# Patient Record
Sex: Female | Born: 1960 | Race: White
Health system: Southern US, Community
[De-identification: ages and names within clinical notes are randomized; demographics above are authoritative.]

---

## 1999-11-20 ENCOUNTER — Other Ambulatory Visit: Admission: RE | Admit: 1999-11-20 | Discharge: 1999-11-20 | Payer: Self-pay | Admitting: Family Medicine

## 2004-09-12 ENCOUNTER — Other Ambulatory Visit: Admission: RE | Admit: 2004-09-12 | Discharge: 2004-09-12 | Payer: Self-pay | Admitting: Family Medicine

## 2020-03-24 ENCOUNTER — Ambulatory Visit: Payer: Self-pay | Attending: Internal Medicine

## 2020-03-24 DIAGNOSIS — Z23 Encounter for immunization: Secondary | ICD-10-CM

## 2020-03-24 NOTE — Progress Notes (Signed)
   Covid-19 Vaccination Clinic  Name:  Mikah Rottinghaus    MRN: [id number] DOB: 05-23-1961  03/24/2020  Ms. Grimm was observed post Covid-19 immunization for 15 minutes without incident. She was provided with Vaccine Information Sheet and instruction to access the V-Safe system.   Ms. Hargrove was instructed to call 911 with any severe reactions post vaccine: Marland Kitchen Difficulty breathing  . Swelling of face and throat  . A fast heartbeat  . A bad rash all over body  . Dizziness and weakness   Immunizations Administered    Name Date Dose VIS Date Route   Moderna COVID-19 Vaccine 03/24/2020  8:20 AM 0.5 mL 11/30/2019 Intramuscular   Manufacturer: Moderna   Lot: [id number]   NDC: 43926-599-78

## 2020-04-25 ENCOUNTER — Ambulatory Visit: Payer: Self-pay | Attending: Internal Medicine

## 2020-04-25 DIAGNOSIS — Z23 Encounter for immunization: Secondary | ICD-10-CM

## 2020-04-25 NOTE — Progress Notes (Signed)
   Covid-19 Vaccination Clinic  Name:  Sherri Phillips    MRN: [id number] DOB: 1961/05/08  04/25/2020  [name] was observed post Covid-19 immunization for 15 minutes without incident. She was provided with Vaccine Information Sheet and instruction to access the V-Safe system.   [name] was instructed to call 911 with any severe reactions post vaccine: Marland Kitchen Difficulty breathing  . Swelling of face and throat  . A fast heartbeat  . A bad rash all over body  . Dizziness and weakness   Immunizations Administered    Name Date Dose VIS Date Route   Moderna COVID-19 Vaccine 04/25/2020  9:45 AM 0.5 mL 11/2019 Intramuscular   Manufacturer: Moderna   Lot: [id number]   NDC: 78242-353-61

## 2020-05-19 ENCOUNTER — Encounter: Payer: Self-pay | Admitting: Family Medicine

## 2020-05-19 ENCOUNTER — Ambulatory Visit (INDEPENDENT_AMBULATORY_CARE_PROVIDER_SITE_OTHER): Payer: BC Managed Care – PPO | Admitting: Family Medicine

## 2020-05-19 ENCOUNTER — Other Ambulatory Visit: Payer: Self-pay

## 2020-05-19 VITALS — BP 132/88 | HR 80 | Temp 98.6°F | Ht 65.0 in | Wt 176.4 lb

## 2020-05-19 DIAGNOSIS — Z7689 Persons encountering health services in other specified circumstances: Secondary | ICD-10-CM

## 2020-05-19 DIAGNOSIS — Z7189 Other specified counseling: Secondary | ICD-10-CM | POA: Diagnosis not present

## 2020-05-19 DIAGNOSIS — Z23 Encounter for immunization: Secondary | ICD-10-CM | POA: Diagnosis not present

## 2020-05-19 NOTE — Progress Notes (Signed)
New Patient Office Visit  Assessment & Plan:  1. Immunization due - Tdap vaccine greater than or equal to 58yo IM  2. Encounter to establish care   Follow-up: Return for annual physical.   Hendricks Limes, MSN, APRN, FNP-C [facility]  Subjective:  Patient ID: Sherri Phillips, female    DOB: 16-Feb-1961  Age: 59 y.o. MRN: [id number]  Patient Care Team: Loman Brooklyn, FNP as PCP - General (Family Medicine)  CC:  Chief Complaint  Patient presents with  . New Patient (Initial Visit)    [place]   . Establish Care    Patient states no complaints     HPI Sherri Phillips presents to establish care. She is transferring care from Dr. Murrell Redden office as he has retired and the office has closed.   Patient has no complaints or concerns. She just wanted to get established so that when she needs a physical and labs for work she can come in quickly.    Review of Systems  Constitutional: Negative for chills, fever, malaise/fatigue and weight loss.  HENT: Negative for congestion, ear discharge, ear pain, nosebleeds, sinus pain, sore throat and tinnitus.   Eyes: Negative for blurred vision, double vision, pain, discharge and redness.  Respiratory: Negative for cough, shortness of breath and wheezing.   Cardiovascular: Negative for chest pain, palpitations and leg swelling.  Gastrointestinal: Negative for abdominal pain, constipation, diarrhea, heartburn, nausea and vomiting.  Genitourinary: Negative for dysuria, frequency and urgency.  Musculoskeletal: Negative for myalgias.  Skin: Negative for rash.  Neurological: Negative for dizziness, seizures, weakness and headaches.  Psychiatric/Behavioral: Negative for depression, substance abuse and suicidal ideas. The patient is not nervous/anxious.    No current outpatient medications on file.  No Known Allergies  History reviewed. No pertinent past medical history.  History reviewed. No pertinent surgical  history.  Family History  Problem Relation Age of Onset  . Anuerysm Father     Social History   Socioeconomic History  . Marital status: Widowed    Spouse name: Not on file  . Number of children: Not on file  . Years of education: Not on file  . Highest education level: Not on file  Occupational History  . Not on file  Tobacco Use  . Smoking status: Current Every Day Smoker    Packs/day: 0.50  . Smokeless tobacco: Never Used  Substance and Sexual Activity  . Alcohol use: Yes    Alcohol/week: 3.0 standard drinks    Types: 3 Cans of beer per week    Comment: Patient states she drinks 3 beers per day after work.  . Drug use: Never  . Sexual activity: Not Currently  Other Topics Concern  . Not on file  Social History Narrative  . Not on file   Social Determinants of Health   Financial Resource Strain:   . Difficulty of Paying Living Expenses:   Food Insecurity:   . Worried About Charity fundraiser in the Last Year:   . Arboriculturist in the Last Year:   Transportation Needs:   . Film/video editor (Medical):   Marland Kitchen Lack of Transportation (Non-Medical):   Physical Activity:   . Days of Exercise per Week:   . Minutes of Exercise per Session:   Stress:   . Feeling of Stress :   Social Connections:   . Frequency of Communication with Friends and Family:   . Frequency of Social Gatherings with Friends and Family:   .  Attends Religious Services:   . Active Member of Clubs or Organizations:   . Attends Banker Meetings:   Marland Kitchen Marital Status:   Intimate Partner Violence:   . Fear of Current or Ex-Partner:   . Emotionally Abused:   Marland Kitchen Physically Abused:   . Sexually Abused:     Objective:   Today's Vitals: BP 132/88 Comment: manual  Pulse 80   Temp 98.6 F (37 C) (Temporal)   Ht 5\' 5"  (1.651 m)   Wt 176 lb 6.4 oz (80 kg)   SpO2 98%   BMI 29.35 kg/m   Physical Exam Vitals reviewed.  Constitutional:      General: She is not in acute  distress.    Appearance: Normal appearance. She is overweight. She is not ill-appearing, toxic-appearing or diaphoretic.  HENT:     Head: Normocephalic and atraumatic.  Eyes:     General: No scleral icterus.       Right eye: No discharge.        Left eye: No discharge.     Conjunctiva/sclera: Conjunctivae normal.  Cardiovascular:     Rate and Rhythm: Normal rate and regular rhythm.     Heart sounds: Normal heart sounds. No murmur. No friction rub. No gallop.   Pulmonary:     Effort: Pulmonary effort is normal. No respiratory distress.     Breath sounds: Normal breath sounds. No stridor. No wheezing, rhonchi or rales.  Musculoskeletal:        General: Normal range of motion.     Cervical back: Normal range of motion.  Skin:    General: Skin is warm and dry.     Capillary Refill: Capillary refill takes less than 2 seconds.  Neurological:     General: No focal deficit present.     Mental Status: She is alert and oriented to person, place, and time. Mental status is at baseline.  Psychiatric:        Mood and Affect: Mood normal.        Behavior: Behavior normal.        Thought Content: Thought content normal.        Judgment: Judgment normal.

## 2020-05-22 ENCOUNTER — Encounter: Payer: Self-pay | Admitting: Family Medicine

## 2020-06-14 ENCOUNTER — Telehealth: Payer: Self-pay | Admitting: Family Medicine

## 2020-08-08 ENCOUNTER — Ambulatory Visit (INDEPENDENT_AMBULATORY_CARE_PROVIDER_SITE_OTHER): Payer: BC Managed Care – PPO | Admitting: Family Medicine

## 2020-08-08 ENCOUNTER — Encounter: Payer: Self-pay | Admitting: Family Medicine

## 2020-08-08 DIAGNOSIS — M79605 Pain in left leg: Secondary | ICD-10-CM

## 2020-08-08 DIAGNOSIS — M7989 Other specified soft tissue disorders: Secondary | ICD-10-CM

## 2020-08-08 NOTE — Progress Notes (Signed)
   Virtual Visit via Telephone Note  I connected with Sherri Phillips on 08/08/20 at 4:36 PM by telephone and verified that I am speaking with the correct person using two identifiers. Sherri Phillips is currently located at home and nobody is currently with her during this visit. The provider, Gwenlyn Fudge, FNP is located in their home at time of visit.  I discussed the limitations, risks, security and privacy concerns of performing an evaluation and management service by telephone and the availability of in person appointments. I also discussed with the patient that there may be a patient responsible charge related to this service. The patient expressed understanding and agreed to proceed.  Subjective: PCP: Gwenlyn Fudge, FNP  Chief Complaint  Patient presents with  . Leg Pain   Patient reports about a month ago she had for cramps in her left leg back to back and has since been having a constant pain in her left leg.  She reports the pain is worse after she has been on her feet for 10 hours at work.  She reports there is mild swelling but no erythema.  States she does have varicose veins.  She has been wearing a brace and applying BenGay.   ROS: Per HPI No current outpatient medications on file.  No Known Allergies History reviewed. No pertinent past medical history.  Observations/Objective: A&O  No respiratory distress or wheezing audible over the phone Mood, judgement, and thought processes all WNL   Assessment and Plan: 1. Pain and swelling of left lower extremity - Discussed importance of u/s to r/o DVT. Patient is not willing to take off work to have u/s completed. Advised to take Ibuprofen/Aleve for pain. Continue Bengay. Add heating pad.  - US Venous Img Lower Unilateral Left; Future   Follow Up Instructions:  I discussed the assessment and treatment plan with the patient. The patient was provided an opportunity to ask questions and all were answered. The patient  agreed with the plan and demonstrated an understanding of the instructions.   The patient was advised to call back or seek an in-person evaluation if the symptoms worsen or if the condition fails to improve as anticipated.  The above assessment and management plan was discussed with the patient. The patient verbalized understanding of and has agreed to the management plan. Patient is aware to call the clinic if symptoms persist or worsen. Patient is aware when to return to the clinic for a follow-up visit. Patient educated on when it is appropriate to go to the emergency department.   Time call ended: 4:46 PM  I provided 12 minutes of non-face-to-face time during this encounter.  [name], MSN, APRN, FNP-C [facility] 08/08/20

## 2020-08-11 ENCOUNTER — Telehealth: Payer: Self-pay | Admitting: Family Medicine

## 2020-08-18 ENCOUNTER — Ambulatory Visit (HOSPITAL_COMMUNITY)
Admission: RE | Admit: 2020-08-18 | Discharge: 2020-08-18 | Disposition: A | Discharge status: Home / Self Care | Payer: BC Managed Care – PPO | Source: Ambulatory Visit | Attending: Family Medicine | Admitting: Family Medicine

## 2020-08-18 ENCOUNTER — Other Ambulatory Visit: Payer: Self-pay

## 2020-08-18 DIAGNOSIS — M7989 Other specified soft tissue disorders: Secondary | ICD-10-CM

## 2020-08-18 DIAGNOSIS — M79662 Pain in left lower leg: Secondary | ICD-10-CM | POA: Diagnosis not present

## 2020-08-18 DIAGNOSIS — M79605 Pain in left leg: Secondary | ICD-10-CM | POA: Diagnosis not present

## 2020-08-21 ENCOUNTER — Telehealth: Payer: Self-pay | Admitting: Family Medicine

## 2020-08-21 NOTE — Telephone Encounter (Signed)
Patient aware and verbalized understanding. °

## 2021-01-18 DIAGNOSIS — Z23 Encounter for immunization: Secondary | ICD-10-CM | POA: Diagnosis not present

## 2021-04-20 ENCOUNTER — Encounter: Payer: Self-pay | Admitting: Family Medicine

## 2021-04-20 ENCOUNTER — Other Ambulatory Visit: Payer: Self-pay

## 2021-04-20 ENCOUNTER — Ambulatory Visit: Payer: BC Managed Care – PPO | Admitting: Family Medicine

## 2021-04-20 VITALS — BP 147/96 | HR 90 | Temp 96.9°F | Ht 65.0 in | Wt 176.4 lb

## 2021-04-20 DIAGNOSIS — L720 Epidermal cyst: Secondary | ICD-10-CM | POA: Diagnosis not present

## 2021-04-20 NOTE — Patient Instructions (Signed)
Epidermoid Cyst  An epidermoid cyst, also called an epidermal cyst, is a small lump under your skin. The cyst contains a substance called keratin. Do not try to pop or open the cyst yourself. What are the causes?  A blocked hair follicle.  A hair that curls and re-enters the skin instead of growing straight out of the skin.  A blocked pore.  Irritated skin.  An injury to the skin.  Certain conditions that are passed along from parent to child.  Human papillomavirus (HPV). This happens rarely when cysts occur on the bottom of the feet.  Long-term sun damage to the skin. What increases the risk?  Having acne.  Being female.  Having an injury to the skin.  Being past puberty.  Having certain conditions caused by genes (genetic disorder) What are the signs or symptoms? These cysts are usually harmless, but they can get infected. Symptoms of infection may include:  Redness.  Inflammation.  Tenderness.  Warmth.  Fever.  A bad-smelling substance that drains from the cyst.  Pus that drains from the cyst. How is this treated? In many cases, epidermoid cysts go away on their own without treatment. If a cyst becomes infected, treatment may include:  Opening and draining the cyst, done by a doctor. After draining, you may need minor surgery to remove the rest of the cyst.  Antibiotic medicine.  Shots of medicines (steroids) that help to reduce inflammation.  Surgery to remove the cyst. Surgery may be done if the cyst: ? Becomes large. ? Bothers you. ? Has a chance of turning into cancer.  Do not try to open a cyst yourself. Follow these instructions at home: Medicines  Take over-the-counter and prescription medicines as told by your doctor.  If you were prescribed an antibiotic medicine, take it as told by your doctor. Do not stop taking it even if you start to feel better. General instructions  Keep the area around your cyst clean and dry.  Wear loose, dry  clothing.  Avoid touching your cyst.  Check your cyst every day for signs of infection. Check for: ? Redness, swelling, or pain. ? Fluid or blood. ? Warmth. ? Pus or a bad smell.  Keep all follow-up visits. How is this prevented?  Wear clean, dry, clothing.  Avoid wearing tight clothing.  Keep your skin clean and dry. Take showers or baths every day. Contact a doctor if:  Your cyst has symptoms of infection.  Your condition does not improve or gets worse.  You have a cyst that looks different from other cysts you have had.  You have a fever. Get help right away if:  Redness spreads from the cyst into the area close by. Summary  An epidermoid cyst is a small lump under your skin.  If a cyst becomes infected, treatment may include surgery to open and drain the cyst, or to remove it.  Take over-the-counter and prescription medicines only as told by your doctor.  Contact a doctor if your condition is not improving or is getting worse.  Keep all follow-up visits. This information is not intended to replace advice given to you by your health care provider. Make sure you discuss any questions you have with your health care provider. Document Revised: 03/22/2020 Document Reviewed: 03/22/2020 Elsevier Patient Education  2021 Elsevier Inc.  

## 2021-04-23 ENCOUNTER — Encounter: Payer: Self-pay | Admitting: Family Medicine

## 2021-04-23 NOTE — Progress Notes (Signed)
   Assessment & Plan:  1. Epidermoid cyst Contents expressed.  Patient given education on epidermoid cyst.  Offered a referral to dermatology, but patient declined at this time.   Follow up plan: Return in about 3 months (around 07/20/2021) for annual physical.  [name], MSN, APRN, FNP-C [facility]  Subjective:   Patient ID: Sherri Phillips, female    DOB: 21-Jan-1961, 60 y.o.   MRN: [id number]  HPI: Sherri Phillips is a 60 y.o. female presenting on 04/20/2021 for knot on right axilla (Patient states that she had a bump on her right axilla and popped it and now it is bigger.  States it has been there a few months. )  Patient reports a knot in her right axilla that has been present for a few months.  It was there before and popped, but has now come back bigger.   ROS: Negative unless specifically indicated above in HPI.   Relevant past medical history reviewed and updated as indicated.   Allergies and medications reviewed and updated.  No current outpatient medications on file.  No Known Allergies  Objective:   BP (!) 147/96   Pulse 90   Temp (!) 96.9 F (36.1 C) (Temporal)   Ht 5\' 5"  (1.651 m)   Wt 176 lb 6.4 oz (80 kg)   SpO2 99%   BMI 29.35 kg/m    Physical Exam Vitals reviewed.  Constitutional:      General: She is not in acute distress.    Appearance: Normal appearance. She is not ill-appearing, toxic-appearing or diaphoretic.  HENT:     Head: Normocephalic and atraumatic.  Eyes:     General: No scleral icterus.       Right eye: No discharge.        Left eye: No discharge.     Conjunctiva/sclera: Conjunctivae normal.  Cardiovascular:     Rate and Rhythm: Normal rate.  Pulmonary:     Effort: Pulmonary effort is normal. No respiratory distress.  Musculoskeletal:        General: Normal range of motion.     Cervical back: Normal range of motion.  Skin:    General: Skin is warm and dry.     Capillary Refill: Capillary refill  takes less than 2 seconds.     Comments: Epidermoid cyst that already had a hole to allow contents to be expressed out.  Patient tolerated well.  Neurological:     General: No focal deficit present.     Mental Status: She is alert and oriented to person, place, and time. Mental status is at baseline.  Psychiatric:        Mood and Affect: Mood normal.        Behavior: Behavior normal.        Thought Content: Thought content normal.        Judgment: Judgment normal.

## 2021-07-13 ENCOUNTER — Encounter: Payer: BC Managed Care – PPO | Admitting: Family Medicine

## 2021-09-17 IMAGING — US US EXTREM LOW VENOUS*L*
1 series · 14 of 24 positions shown · non-contrast
Comparison: None.

CLINICAL DATA: 59-year-old female with left lower extremity pain
and swelling for 1 month

EXAM:
LEFT LOWER EXTREMITY VENOUS DOPPLER ULTRASOUND
TECHNIQUE: Gray-scale sonography with compression, as well as color and duplex
ultrasound, were performed to evaluate the deep venous system(s)
from the level of the common femoral vein through the popliteal and
proximal calf veins.

[Series 1: us venous img lower uni left (dvt) · portal-venous · 14 of 35 slices shown]
[im 1/35]
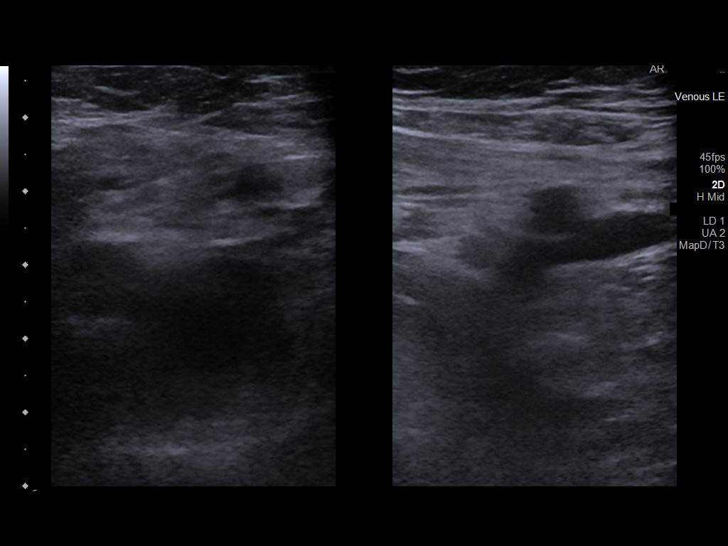
[im 3/35]
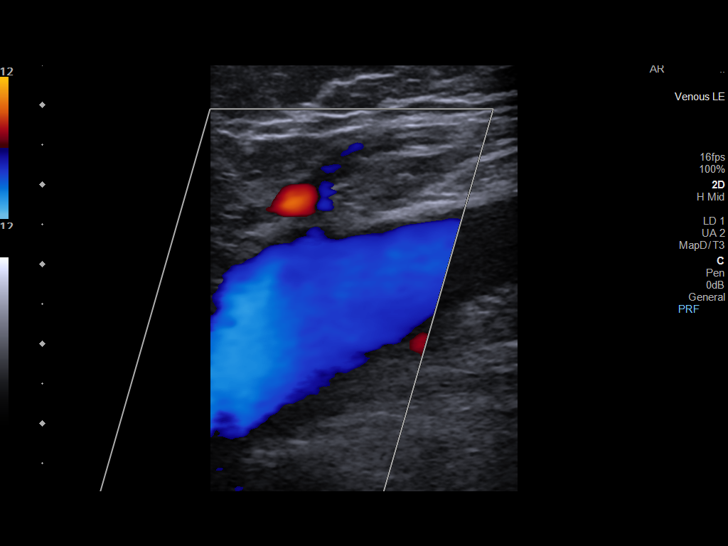
[im 6/35]
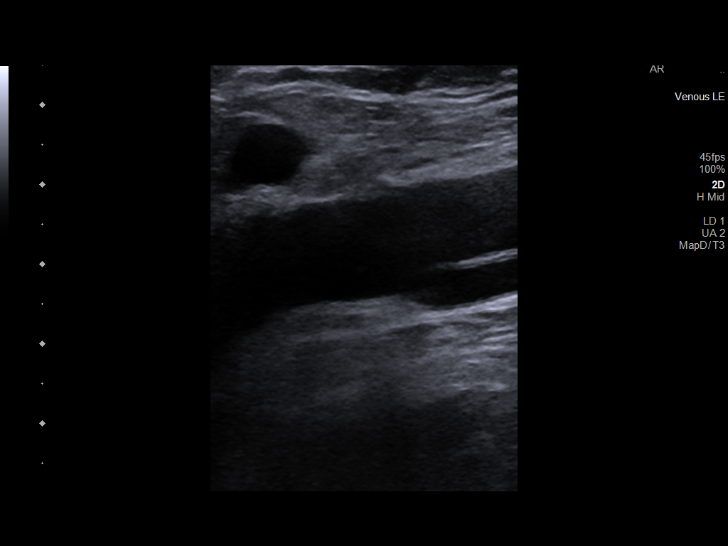
[im 9/35]
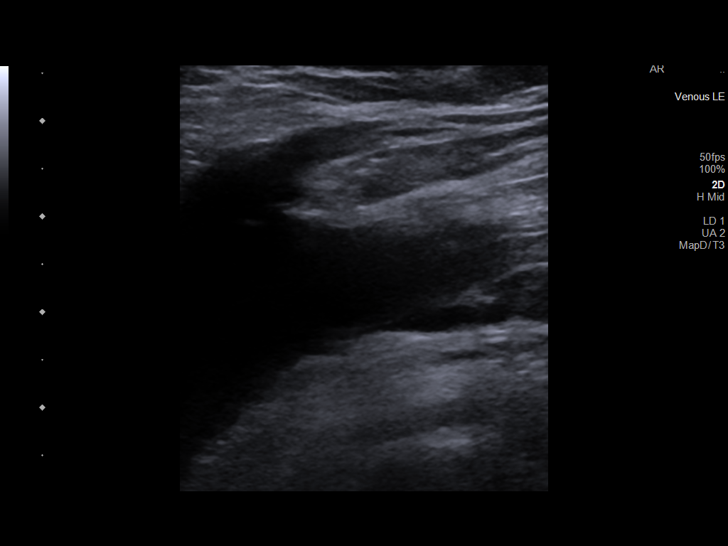
[im 11/35]
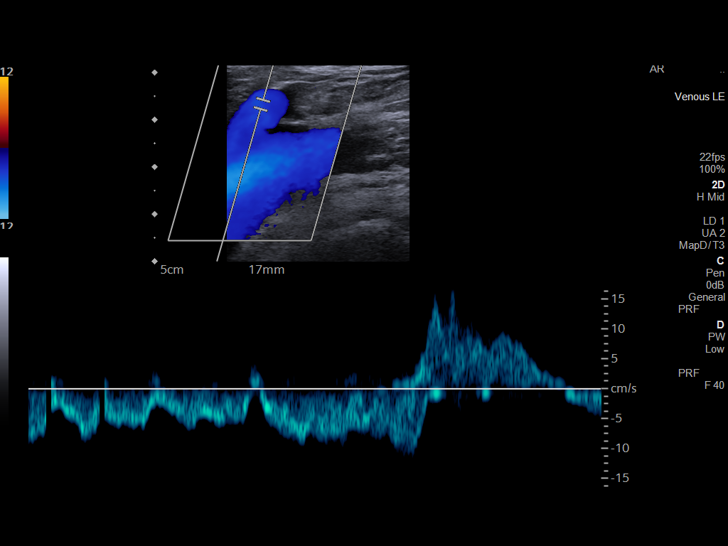
[im 14/35]
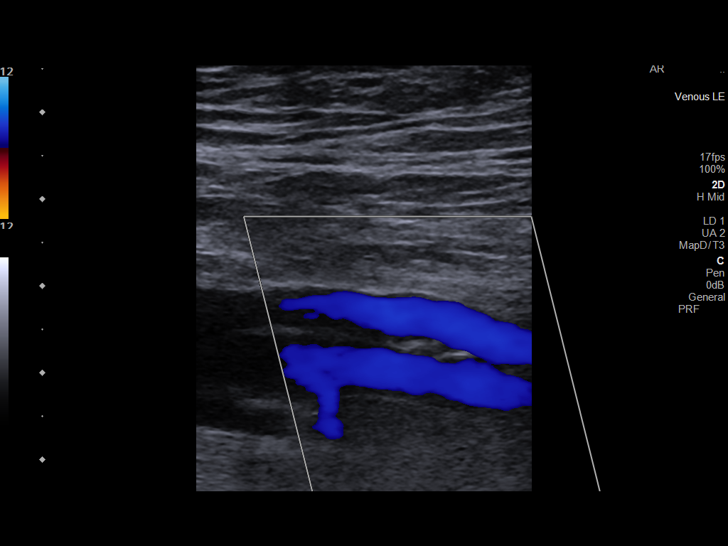
[im 17/35]
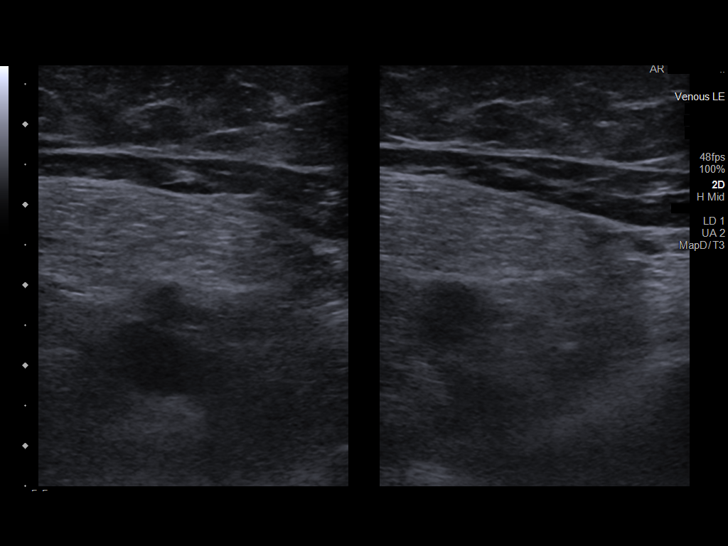
[im 18/35]
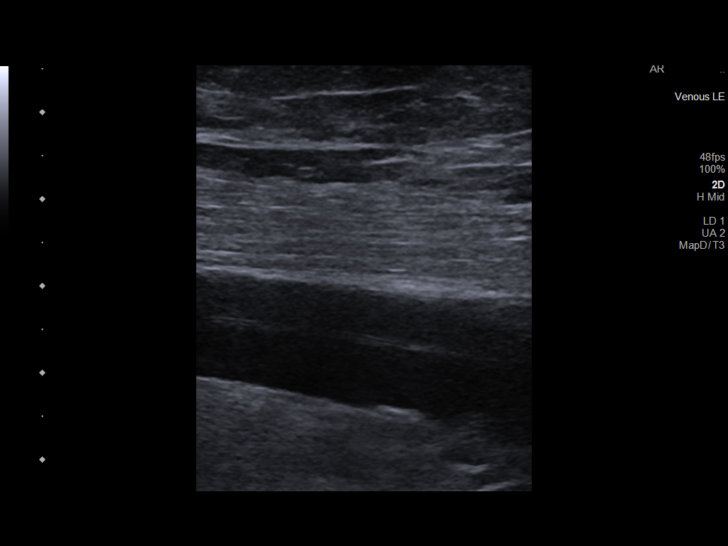
[im 21/35]
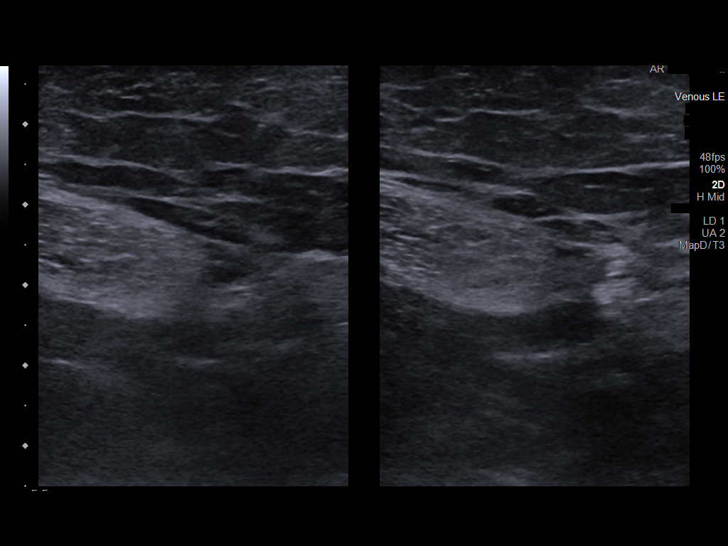
[im 24/35]
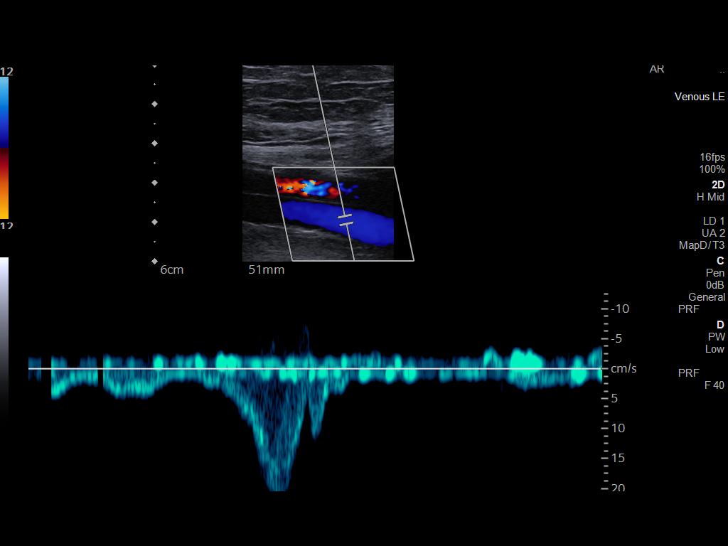
[im 27/35]
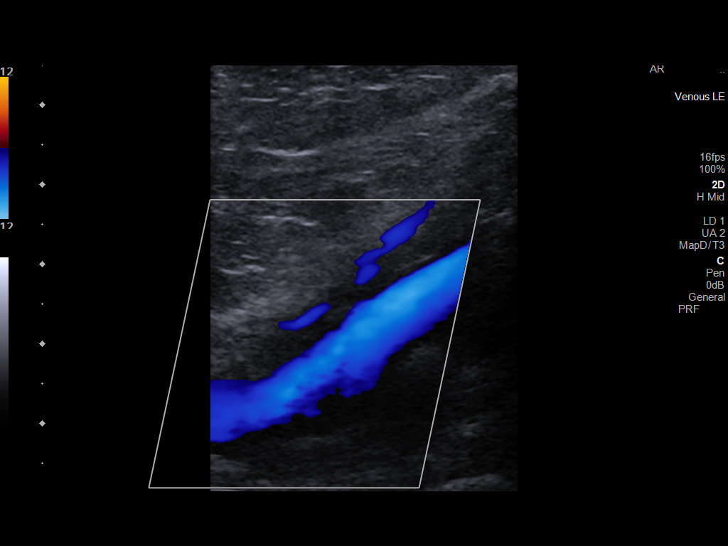
[im 29/35]
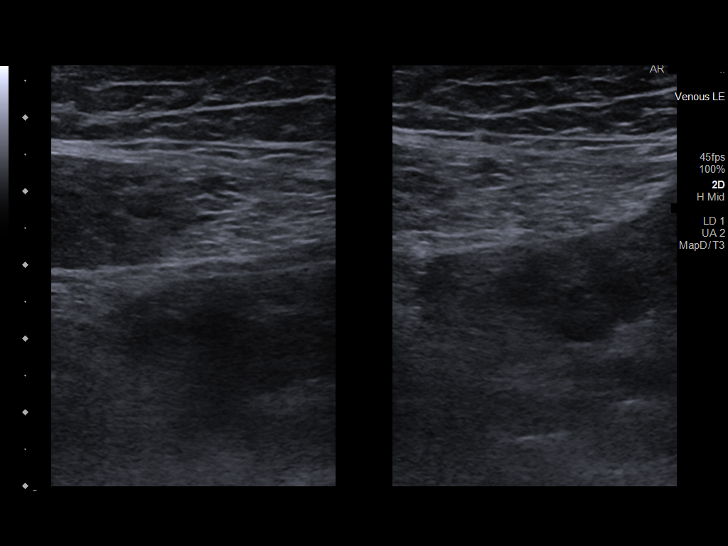
[im 32/35]
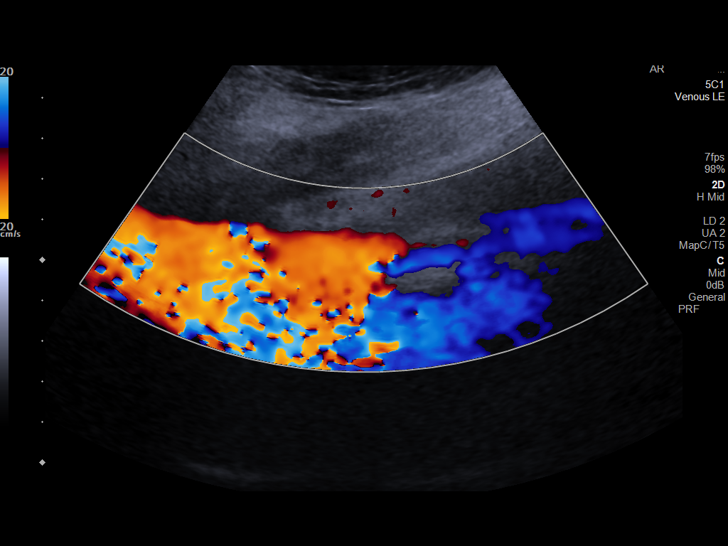
[im 35/35]
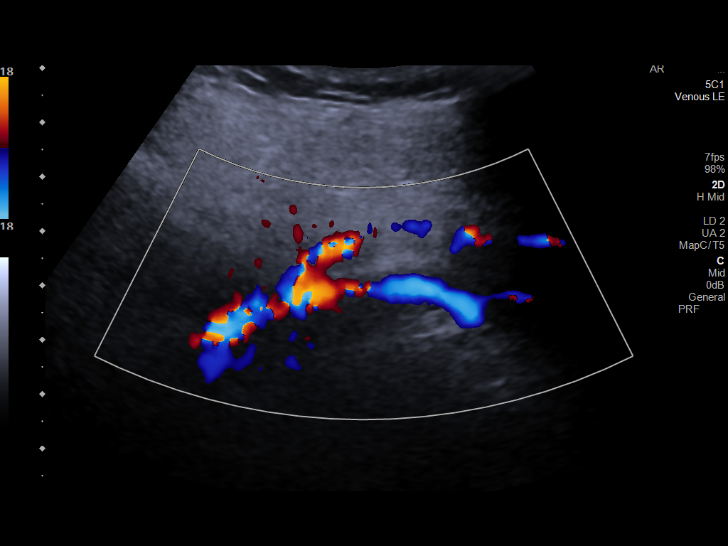

[14 of 24 positions shown; findings below may reference images not displayed]

FINDINGS: VENOUS

Normal compressibility of the common femoral, superficial femoral,
and popliteal veins, as well as the visualized calf veins.
Visualized portions of profunda femoral vein and great saphenous
vein unremarkable. No filling defects to suggest DVT on grayscale or
color Doppler imaging. Doppler waveforms show normal direction of
venous flow, normal respiratory plasticity and response to
augmentation.

Limited views of the contralateral common femoral vein are
unremarkable.

OTHER

None.

Limitations: none
IMPRESSION: Negative.

## 2021-10-18 DIAGNOSIS — Z23 Encounter for immunization: Secondary | ICD-10-CM | POA: Diagnosis not present

## 2023-06-13 ENCOUNTER — Encounter (HOSPITAL_COMMUNITY): Payer: Self-pay

## 2023-06-13 ENCOUNTER — Emergency Department (HOSPITAL_COMMUNITY): Payer: BC Managed Care – PPO

## 2023-06-13 ENCOUNTER — Other Ambulatory Visit: Payer: Self-pay

## 2023-06-13 ENCOUNTER — Emergency Department (HOSPITAL_COMMUNITY)
Admission: EM | Admit: 2023-06-13 | Discharge: 2023-06-13 | Disposition: A | Discharge status: Home / Self Care | Payer: BC Managed Care – PPO | Attending: Emergency Medicine | Admitting: Emergency Medicine

## 2023-06-13 DIAGNOSIS — M722 Plantar fascial fibromatosis: Secondary | ICD-10-CM | POA: Diagnosis not present

## 2023-06-13 DIAGNOSIS — Z87891 Personal history of nicotine dependence: Secondary | ICD-10-CM | POA: Diagnosis not present

## 2023-06-13 DIAGNOSIS — M79671 Pain in right foot: Secondary | ICD-10-CM | POA: Diagnosis not present

## 2023-06-13 MED ORDER — ACETAMINOPHEN 500 MG PO TABS: 500.0000 mg | ORAL_TABLET | Freq: Four times a day (QID) | ORAL | 0 refills | Status: AC | PRN

## 2023-06-13 NOTE — ED Triage Notes (Signed)
Pt comes in with complaints of rt heel pain starting over 3 months ago. Pt points between her heel and arch. Pt denies injury to her foot. Pt states "hurts to walk."

## 2023-06-13 NOTE — ED Provider Notes (Signed)
[place] EMERGENCY DEPARTMENT AT [facility] Provider Note   CSN: [id number] Arrival date & time: 06/13/23  4098     History  Chief Complaint  Patient presents with   Foot Pain    Sherri Phillips is a 62 y.o. female.  HPI    62 year old female comes in with chief complaint of right heel pain.  Patient states that her pain started about 3 months ago.  Pain is worse every time she walks.  Pain is primarily close to the arch/front of the heel.  Patient currently works 12 hours a day 3 or 4 days a week, and requires a lot of standing and walking.  Home Medications Prior to Admission medications   Medication Sig Start Date End Date Taking? Authorizing Provider  acetaminophen (TYLENOL) 500 MG tablet Take 1 tablet (500 mg total) by mouth every 6 (six) hours as needed. 06/13/23  Yes Derwood Kaplan, MD      Allergies    Patient has no known allergies.    Review of Systems   Review of Systems  All other systems reviewed and are negative.   Physical Exam Updated Vital Signs BP (!) 156/99 (BP Location: Right Arm)   Pulse 84   Temp 97.9 F (36.6 C) (Oral)   Resp 16   SpO2 98%  Physical Exam Vitals and nursing note reviewed.  Constitutional:      Appearance: She is well-developed.  Musculoskeletal:        General: Tenderness present.     Comments: Patient has tenderness over the arch of the heel with palpation.  No gross deformity.  No callus.  No fluctuance.  Skin:    General: Skin is warm and dry.  Neurological:     Mental Status: She is alert and oriented to person, place, and time.     ED Results / Procedures / Treatments   Labs (all labs ordered are listed, but only abnormal results are displayed) Labs Reviewed - No data to display  EKG None  Radiology DG Foot Complete Right  Result Date: 06/13/2023 CLINICAL DATA:  One-month history of pain along the plantar heel. No known injury. EXAM: RIGHT FOOT COMPLETE - 3 VIEW COMPARISON:  None Available.  FINDINGS: There is no evidence of fracture or dislocation. There is no evidence of arthropathy or other focal bone abnormality. Soft tissues are unremarkable. IMPRESSION: No focal osseous abnormality. Electronically Signed   By: Agustin Cree M.D.   On: 06/13/2023 09:02    Procedures Procedures    Medications Ordered in ED Medications - No data to display  ED Course/ Medical Decision Making/ A&P                             Medical Decision Making Amount and/or Complexity of Data Reviewed Radiology: ordered.  Risk OTC drugs.  62 year old patient comes in with chief complaint of heel pain.  Differential diagnosis considered for her includes Charcot's foot, pathologic fracture, arthritic changes, plantar fasciitis.  Patient has history of heavy tobacco use disorder.  Neurovascularly she is intact.  No signs of infection.  X-ray of the foot was ordered in triage, have independently interpreted x-ray and there is no evidence of fracture.  Suspect plantar fasciitis and will treat accordingly.  Final Clinical Impression(s) / ED Diagnoses Final diagnoses:  Plantar fasciitis    Rx / DC Orders ED Discharge Orders          Ordered  acetaminophen (TYLENOL) 500 MG tablet  Every 6 hours PRN        06/13/23 0919              Derwood Kaplan, MD 06/13/23 [phone]

## 2023-06-13 NOTE — Discharge Instructions (Signed)
We suspect that your symptoms of heel pain are because of plantar fasciitis. He read the instructions provided on the diagnosis and also proceed with the rehab exercises that I recommended.  As discussed, the main remedy is going to be going to a shoe store.  You might need insoles or arch support that can help you.  Quick search on Google reveals this following shoe store that might be able to help you: Located in: [facility] Address: [address] [place], [place], [place] [address] Hours:  Opens 10?AM Phone: [phone]   If you do not improve with rehab and new insoles in the next 2 weeks, call your PCP.

## 2023-10-21 ENCOUNTER — Encounter: Payer: Self-pay | Admitting: *Deleted

## 2023-11-24 DIAGNOSIS — Z299 Encounter for prophylactic measures, unspecified: Secondary | ICD-10-CM | POA: Diagnosis not present

## 2023-11-24 DIAGNOSIS — M79671 Pain in right foot: Secondary | ICD-10-CM | POA: Diagnosis not present

## 2023-11-24 DIAGNOSIS — M67479 Ganglion, unspecified ankle and foot: Secondary | ICD-10-CM | POA: Diagnosis not present

## 2023-11-28 DIAGNOSIS — M19071 Primary osteoarthritis, right ankle and foot: Secondary | ICD-10-CM | POA: Diagnosis not present

## 2023-11-28 DIAGNOSIS — M79671 Pain in right foot: Secondary | ICD-10-CM | POA: Diagnosis not present

## 2023-12-12 DIAGNOSIS — Z0000 Encounter for general adult medical examination without abnormal findings: Secondary | ICD-10-CM | POA: Diagnosis not present

## 2023-12-12 DIAGNOSIS — Z79899 Other long term (current) drug therapy: Secondary | ICD-10-CM | POA: Diagnosis not present

## 2023-12-12 DIAGNOSIS — R5383 Other fatigue: Secondary | ICD-10-CM | POA: Diagnosis not present

## 2023-12-12 DIAGNOSIS — Z299 Encounter for prophylactic measures, unspecified: Secondary | ICD-10-CM | POA: Diagnosis not present

## 2023-12-12 DIAGNOSIS — E7800 Pure hypercholesterolemia, unspecified: Secondary | ICD-10-CM | POA: Diagnosis not present

## 2023-12-12 DIAGNOSIS — Z1331 Encounter for screening for depression: Secondary | ICD-10-CM | POA: Diagnosis not present

## 2023-12-12 DIAGNOSIS — F17210 Nicotine dependence, cigarettes, uncomplicated: Secondary | ICD-10-CM | POA: Diagnosis not present

## 2024-02-24 ENCOUNTER — Encounter: Payer: Self-pay | Admitting: *Deleted

## 2024-02-24 DIAGNOSIS — Z1231 Encounter for screening mammogram for malignant neoplasm of breast: Secondary | ICD-10-CM

## 2024-03-02 ENCOUNTER — Other Ambulatory Visit: Payer: Self-pay | Admitting: Family Medicine

## 2024-03-02 DIAGNOSIS — Z1231 Encounter for screening mammogram for malignant neoplasm of breast: Secondary | ICD-10-CM

## 2024-03-03 ENCOUNTER — Other Ambulatory Visit: Payer: Self-pay | Admitting: Family Medicine

## 2024-03-03 ENCOUNTER — Ambulatory Visit
Admission: RE | Admit: 2024-03-03 | Discharge: 2024-03-03 | Disposition: A | Discharge status: Home / Self Care | Payer: BC Managed Care – PPO | Source: Ambulatory Visit | Attending: Family Medicine | Admitting: Family Medicine

## 2024-03-03 DIAGNOSIS — Z1231 Encounter for screening mammogram for malignant neoplasm of breast: Secondary | ICD-10-CM

## 2024-03-10 ENCOUNTER — Other Ambulatory Visit: Payer: Self-pay | Admitting: Family Medicine

## 2024-03-10 DIAGNOSIS — R928 Other abnormal and inconclusive findings on diagnostic imaging of breast: Secondary | ICD-10-CM

## 2024-04-02 ENCOUNTER — Ambulatory Visit
Admission: RE | Admit: 2024-04-02 | Discharge: 2024-04-02 | Disposition: A | Discharge status: Home / Self Care | Source: Ambulatory Visit | Attending: Family Medicine | Admitting: Family Medicine

## 2024-04-02 DIAGNOSIS — R928 Other abnormal and inconclusive findings on diagnostic imaging of breast: Secondary | ICD-10-CM

## 2024-04-05 ENCOUNTER — Other Ambulatory Visit: Payer: Self-pay | Admitting: Family Medicine

## 2024-04-05 DIAGNOSIS — N6489 Other specified disorders of breast: Secondary | ICD-10-CM
